# Patient Record
Sex: Male | Born: 1995 | Hispanic: Yes | Smoking: Current some day smoker
Health system: Southern US, Community
[De-identification: ages and names within clinical notes are randomized; demographics above are authoritative.]

---
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
## 2016-03-30 ENCOUNTER — Emergency Department (HOSPITAL_COMMUNITY)
Admission: EM | Admit: 2016-03-30 | Discharge: 2016-03-30 | Disposition: A | Discharge status: Home / Self Care | Payer: Self-pay | Attending: Emergency Medicine | Admitting: Emergency Medicine

## 2016-03-30 ENCOUNTER — Encounter (HOSPITAL_COMMUNITY): Payer: Self-pay | Admitting: Emergency Medicine

## 2016-03-30 DIAGNOSIS — S61213A Laceration without foreign body of left middle finger without damage to nail, initial encounter: Secondary | ICD-10-CM | POA: Insufficient documentation

## 2016-03-30 DIAGNOSIS — Y999 Unspecified external cause status: Secondary | ICD-10-CM | POA: Insufficient documentation

## 2016-03-30 DIAGNOSIS — Z23 Encounter for immunization: Secondary | ICD-10-CM | POA: Insufficient documentation

## 2016-03-30 DIAGNOSIS — W260XXA Contact with knife, initial encounter: Secondary | ICD-10-CM | POA: Insufficient documentation

## 2016-03-30 DIAGNOSIS — Y929 Unspecified place or not applicable: Secondary | ICD-10-CM | POA: Insufficient documentation

## 2016-03-30 DIAGNOSIS — Y9389 Activity, other specified: Secondary | ICD-10-CM | POA: Insufficient documentation

## 2016-03-30 MED ORDER — TETANUS-DIPHTH-ACELL PERTUSSIS 5-2.5-18.5 LF-MCG/0.5 IM SUSP
0.5000 mL | Freq: Once | INTRAMUSCULAR | Status: AC
  Administered 2016-03-30: 0.5 mL via INTRAMUSCULAR
  Filled 2016-03-30: qty 0.5

## 2016-03-30 NOTE — ED Provider Notes (Signed)
[facility]-EMERGENCY DEPT Provider Note   CSN: [id number] Arrival date & time: 03/30/16  1729   By signing my name below, I, Soijett Blue, attest that this documentation has been prepared under the direction and in the presence of Bethel BornKelly Marie Louretta Tantillo, PA-C Electronically Signed: Soijett Blue, ED Scribe. 03/30/16. 6:53 PM.  History   Chief Complaint Chief Complaint  Patient presents with  . Finger Injury    HPI Johnny Powell is a 21 y.o. male who presents to the Emergency Department complaining of left middle finger injury occurring 2 hours ago PTA. Pt reports associated left middle finger pain and laceration to left middle finger. Pt has not tried any medications for the relief of his symptoms. He states that he was working on his car when he accidentally cut his left middle finger with a knife. Per pt chart review, his last tetanus vaccination was 02/04/2007. He denies numbness, weakness, color change, swelling, and any other symptoms.    The history is provided by the patient. No language interpreter was used.    History reviewed. No pertinent past medical history.  Patient Active Problem List   Diagnosis Date Noted  . Shin splints 09/02/2013  . Other allergic rhinitis 09/02/2013  . SCOLIOSIS, THORACOLUMBAR, MILD 02/21/2009  . ALLERGIC RHINITIS 12/08/2008  . Pectus carinatum 12/08/2008  . UPPER RESPIRATORY INFECTION, VIRAL 02/04/2007    History reviewed. No pertinent surgical history.     Home Medications    Prior to Admission medications   Medication Sig Start Date End Date Taking? Authorizing Provider  cetirizine (ZYRTEC) 10 MG tablet Take 1 tablet (10 mg total) by mouth daily. 09/02/13   Jonetta OsgoodKirsten Brown, MD  fluticasone (FLONASE) 50 MCG/ACT nasal spray Place 2 sprays into both nostrils daily. 1 spray in each nostril every day 09/02/13   Jonetta OsgoodKirsten Brown, MD    Family History History reviewed. No pertinent family history.  Social History Social History  Substance Use Topics    . Smoking status: Never Smoker  . Smokeless tobacco: Never Used  . Alcohol use No     Allergies   Patient has no known allergies.   Review of Systems Review of Systems  Musculoskeletal: Positive for arthralgias (left middle finger). Negative for joint swelling.  Skin: Positive for wound (abrasion to left middle finger). Negative for color change.  Neurological: Negative for numbness.     Physical Exam Updated Vital Signs BP 134/81 (BP Location: Left Arm)   Pulse 77   Temp 98 F (36.7 C) (Oral)   Resp 16   SpO2 99%   Physical Exam  Constitutional: He is oriented to person, place, and time. He appears well-developed and well-nourished. No distress.  HENT:  Head: Normocephalic and atraumatic.  Eyes: EOM are normal.  Neck: Neck supple.  Cardiovascular: Normal rate.   Pulmonary/Chest: Effort normal. No respiratory distress.  Abdominal: He exhibits no distension.  Musculoskeletal: Normal range of motion.       Left hand: He exhibits laceration. He exhibits normal range of motion. Normal sensation noted. Normal strength noted.  Left hand: 2.5 cm U-shaped laceration over the DIP joint of the left middle finger. Bleeding is controlled. FROM of finger. NVI. See Haiku picture for details  Neurological: He is alert and oriented to person, place, and time.  Skin: Skin is warm and dry.  Psychiatric: He has a normal mood and affect. His behavior is normal.  Nursing note and vitals reviewed.      ED Treatments / Results  DIAGNOSTIC STUDIES:  Oxygen Saturation is 99% on RA, nl by my interpretation.    COORDINATION OF CARE: 6:51 PM Discussed treatment plan with pt at bedside which includes laceration repair, update tetanus vaccination, and pt agreed to plan.   Procedures .Marland KitchenLaceration Repair Date/Time: 03/30/2016 7:06 PM Performed by: Terance Hart MARIE Authorized by: Terance Hart MARIE   Consent:    Consent obtained:  Verbal   Consent given by:  Patient   Risks  discussed:  Pain and need for additional repair Anesthesia (see MAR for exact dosages):    Anesthesia method:  Local infiltration   Local anesthetic:  Lidocaine 2% w/o epi (5 cc used) Laceration details:    Location:  Finger   Finger location:  L long finger   Length (cm):  2.5 Repair type:    Repair type:  Simple Pre-procedure details:    Preparation:  Patient was prepped and draped in usual sterile fashion Exploration:    Hemostasis achieved with:  Direct pressure   Wound exploration: wound explored through full range of motion and entire depth of wound probed and visualized     Wound extent: no foreign bodies/material noted and no muscle damage noted     Contaminated: no   Treatment:    Area cleansed with:  Saline   Amount of cleaning:  Standard   Irrigation solution:  Sterile saline   Irrigation method:  Syringe   Visualized foreign bodies/material removed: no   Skin repair:    Repair method:  Sutures   Suture size:  4-0   Suture material:  Prolene   Suture technique:  Simple interrupted   Number of sutures:  5 Approximation:    Approximation:  Close Post-procedure details:    Dressing:  Adhesive bandage and sterile dressing   Patient tolerance of procedure:  Tolerated well, no immediate complications     (including critical care time)  Medications Ordered in ED Medications  Tdap (BOOSTRIX) injection 0.5 mL (0.5 mLs Intramuscular Given 03/30/16 2003)     Initial Impression / Assessment and Plan / ED Course  I have reviewed the triage vital signs and the nursing notes.  21 year old male with finger laceration. Tetanus updated in the ED. Laceration occurred < 12 hours prior to repair. Patient tolerated repair well. Discussed laceration care with pt and answered questions. Pt to f-u for suture removal in 10 days and wound check sooner should there be signs of dehiscence or infection. Pt is hemodynamically stable with no complaints prior to dc.    Final Clinical  Impressions(s) / ED Diagnoses   Final diagnoses:  Laceration of left middle finger without foreign body without damage to nail, initial encounter    New Prescriptions New Prescriptions   No medications on file   I personally performed the services described in this documentation, which was scribed in my presence. The recorded information has been reviewed and is accurate.     Bethel Born, PA-C 03/31/16 0030    Eber Hong, MD 03/31/16 [phone]

## 2016-03-30 NOTE — ED Notes (Signed)
Placed patient into room waiting on provider

## 2016-03-30 NOTE — ED Triage Notes (Signed)
Pt cut his hand while working on his car. Small abrasion to left middle finger. Pt able to move finger. Bleeding controlled

## 2016-03-30 NOTE — ED Notes (Signed)
Pt stable, ambulatory, states understanding of discharge instructions 

## 2016-03-30 NOTE — Discharge Instructions (Signed)
Keep area clean and dry. You can wash with soap and water Change bandage at least once daily, more if it is dirty Watch for signs of infection (redness, drainage) Have stitches removed in 10 days

## 2017-06-11 ENCOUNTER — Other Ambulatory Visit: Payer: Self-pay | Admitting: Internal Medicine

## 2019-05-28 ENCOUNTER — Ambulatory Visit (HOSPITAL_COMMUNITY)
Admission: EM | Admit: 2019-05-28 | Discharge: 2019-05-28 | Disposition: A | Discharge status: Home / Self Care | Payer: BC Managed Care – PPO | Attending: Urgent Care | Admitting: Urgent Care

## 2019-05-28 ENCOUNTER — Encounter (HOSPITAL_COMMUNITY): Payer: Self-pay

## 2019-05-28 ENCOUNTER — Other Ambulatory Visit: Payer: Self-pay

## 2019-05-28 DIAGNOSIS — Z8659 Personal history of other mental and behavioral disorders: Secondary | ICD-10-CM

## 2019-05-28 DIAGNOSIS — F439 Reaction to severe stress, unspecified: Secondary | ICD-10-CM

## 2019-05-28 DIAGNOSIS — R5383 Other fatigue: Secondary | ICD-10-CM

## 2019-05-28 DIAGNOSIS — F419 Anxiety disorder, unspecified: Secondary | ICD-10-CM

## 2019-05-28 MED ORDER — FLUOXETINE HCL 10 MG PO TABS: 10.0000 mg | ORAL_TABLET | Freq: Every day | ORAL | 0 refills | Status: DC

## 2019-05-28 NOTE — Discharge Instructions (Addendum)
For the first 2 weeks, start with 1 capsule daily. After that, if you are tolerating the medicine okay then increase to 2 capsules daily.

## 2019-05-28 NOTE — ED Triage Notes (Signed)
Pt states he needs a refill of his anxiety medication, he do not remember the name.

## 2019-05-28 NOTE — ED Provider Notes (Signed)
[place]   MRN: [id number] DOB: 1995/12/12  Subjective:   Johnny Powell is a 24 y.o. male presenting for 4-week history of recurrent fatigue, anxiety.  Patient previously took fluoxetine but this was several years ago.  Currently patient has significant stressors including recently moving in with his fiance, financial stressors as well.  One of his family members is very ill as well.  States that he is having a difficult time controlling his worrying and feels like he is starting to get depressed as well.  Denies any suicidal, homicidal ideation.  Patient has previously done very well with fluoxetine.  He does not currently have a PCP.  No current facility-administered medications for this encounter.  Current Outpatient Medications:  .  cetirizine (ZYRTEC) 10 MG tablet, Take 1 tablet (10 mg total) by mouth daily., Disp: 30 tablet, Rfl: 12 .  fluticasone (FLONASE) 50 MCG/ACT nasal spray, Place 2 sprays into both nostrils daily. 1 spray in each nostril every day, Disp: 16 g, Rfl: 12   No Known Allergies  History reviewed. No pertinent past medical history.   History reviewed. No pertinent surgical history.  History reviewed. No pertinent family history.  Social History   Tobacco Use  . Smoking status: Never Smoker  . Smokeless tobacco: Never Used  Substance Use Topics  . Alcohol use: No  . Drug use: No    ROS   Objective:   Vitals: BP 123/79 (BP Location: Right Arm)   Pulse 73   Temp 98.7 F (37.1 C) (Oral)   Resp 17   SpO2 100%   Physical Exam Constitutional:      General: He is not in acute distress.    Appearance: Normal appearance. He is well-developed and normal weight. He is not ill-appearing, toxic-appearing or diaphoretic.  HENT:     Head: Normocephalic and atraumatic.     Right Ear: External ear normal.     Left Ear: External ear normal.     Nose: Nose normal.     Mouth/Throat:     Pharynx: Oropharynx is clear.  Eyes:     General: No  scleral icterus.       Right eye: No discharge.        Left eye: No discharge.     Extraocular Movements: Extraocular movements intact.     Pupils: Pupils are equal, round, and reactive to light.  Cardiovascular:     Rate and Rhythm: Normal rate.  Pulmonary:     Effort: Pulmonary effort is normal.  Musculoskeletal:     Cervical back: Normal range of motion.  Neurological:     Mental Status: He is alert and oriented to person, place, and time.  Psychiatric:        Mood and Affect: Mood is depressed. Mood is not anxious or elated. Affect is flat. Affect is not labile, blunt, angry, tearful or inappropriate.        Speech: He is communicative. Speech is not rapid and pressured, delayed, slurred or tangential.        Behavior: Behavior is slowed. Behavior is not agitated, aggressive, withdrawn, hyperactive or combative. Behavior is cooperative.        Thought Content: Thought content normal. Thought content is not paranoid or delusional. Thought content does not include homicidal or suicidal ideation.        Cognition and Memory: Cognition normal.        Judgment: Judgment normal.     Comments: Monotone speech.  Assessment and Plan :   PDMP not reviewed this encounter.  1. Anxiety   2. Stress   3. History of depression   4. Fatigue, unspecified type   5. Decreased energy     Will restart fluoxetine and counseled patient on titrating his dose up to 20 mg over a period of 2 weeks.  Recommended he establish care with a new PCP, information provided to the patient for this. Counseled patient on potential for adverse effects with medications prescribed/recommended today, ER and return-to-clinic precautions discussed, patient verbalized understanding.    Wallis Bamberg, [place] 05/29/19 [phone]

## 2021-09-22 ENCOUNTER — Ambulatory Visit (HOSPITAL_COMMUNITY)
Admission: EM | Admit: 2021-09-22 | Discharge: 2021-09-23 | Discharge status: Against Medical Advice | Payer: No Payment, Other | Attending: Family | Admitting: Family

## 2021-09-22 DIAGNOSIS — F1010 Alcohol abuse, uncomplicated: Secondary | ICD-10-CM | POA: Insufficient documentation

## 2021-09-22 DIAGNOSIS — R45851 Suicidal ideations: Secondary | ICD-10-CM | POA: Insufficient documentation

## 2021-09-22 DIAGNOSIS — Z20822 Contact with and (suspected) exposure to covid-19: Secondary | ICD-10-CM | POA: Insufficient documentation

## 2021-09-22 DIAGNOSIS — F1090 Alcohol use, unspecified, uncomplicated: Secondary | ICD-10-CM

## 2021-09-22 DIAGNOSIS — F322 Major depressive disorder, single episode, severe without psychotic features: Secondary | ICD-10-CM | POA: Diagnosis not present

## 2021-09-22 DIAGNOSIS — R630 Anorexia: Secondary | ICD-10-CM | POA: Diagnosis not present

## 2021-09-22 DIAGNOSIS — F411 Generalized anxiety disorder: Secondary | ICD-10-CM | POA: Diagnosis not present

## 2021-09-22 DIAGNOSIS — F1110 Opioid abuse, uncomplicated: Secondary | ICD-10-CM | POA: Insufficient documentation

## 2021-09-22 DIAGNOSIS — Z560 Unemployment, unspecified: Secondary | ICD-10-CM | POA: Insufficient documentation

## 2021-09-22 DIAGNOSIS — Z79899 Other long term (current) drug therapy: Secondary | ICD-10-CM | POA: Insufficient documentation

## 2021-09-22 LAB — POCT URINE DRUG SCREEN - MANUAL ENTRY (I-SCREEN)
POC Amphetamine UR: NOT DETECTED
POC Buprenorphine (BUP): NOT DETECTED
POC Cocaine UR: NOT DETECTED
POC Marijuana UR: POSITIVE — AB
POC Methadone UR: NOT DETECTED
POC Methamphetamine UR: NOT DETECTED
POC Morphine: NOT DETECTED
POC Oxazepam (BZO): NOT DETECTED
POC Oxycodone UR: NOT DETECTED
POC Secobarbital (BAR): NOT DETECTED

## 2021-09-22 LAB — CBC WITH DIFFERENTIAL/PLATELET
Abs Immature Granulocytes: 0.03 10*3/uL (ref 0.00–0.07)
Basophils Absolute: 0 10*3/uL (ref 0.0–0.1)
Basophils Relative: 0 %
Eosinophils Absolute: 0.1 10*3/uL (ref 0.0–0.5)
Eosinophils Relative: 1 %
HCT: 44.7 % (ref 39.0–52.0)
Hemoglobin: 15.9 g/dL (ref 13.0–17.0)
Immature Granulocytes: 0 %
Lymphocytes Relative: 14 %
Lymphs Abs: 1.3 10*3/uL (ref 0.7–4.0)
MCH: 32 pg (ref 26.0–34.0)
MCHC: 35.6 g/dL (ref 30.0–36.0)
MCV: 89.9 fL (ref 80.0–100.0)
Monocytes Absolute: 0.6 10*3/uL (ref 0.1–1.0)
Monocytes Relative: 7 %
Neutro Abs: 7 10*3/uL (ref 1.7–7.7)
Neutrophils Relative %: 78 %
Platelets: 293 10*3/uL (ref 150–400)
RBC: 4.97 MIL/uL (ref 4.22–5.81)
RDW: 12.7 % (ref 11.5–15.5)
WBC: 9 10*3/uL (ref 4.0–10.5)
nRBC: 0 % (ref 0.0–0.2)

## 2021-09-22 LAB — COMPREHENSIVE METABOLIC PANEL
ALT: 34 U/L (ref 0–44)
AST: 30 U/L (ref 15–41)
Albumin: 4.5 g/dL (ref 3.5–5.0)
Alkaline Phosphatase: 92 U/L (ref 38–126)
Anion gap: 10 (ref 5–15)
BUN: 9 mg/dL (ref 6–20)
CO2: 28 mmol/L (ref 22–32)
Calcium: 9.3 mg/dL (ref 8.9–10.3)
Chloride: 102 mmol/L (ref 98–111)
Creatinine, Ser: 1.25 mg/dL — ABNORMAL HIGH (ref 0.61–1.24)
GFR, Estimated: >60 mL/min (ref >60)
Glucose, Bld: 104 mg/dL — ABNORMAL HIGH (ref 70–99)
Potassium: 3.7 mmol/L (ref 3.5–5.1)
Sodium: 140 mmol/L (ref 135–145)
Total Bilirubin: 0.5 mg/dL (ref 0.3–1.2)
Total Protein: 8.1 g/dL (ref 6.5–8.1)

## 2021-09-22 LAB — RESP PANEL BY RT-PCR (FLU A&B, COVID) ARPGX2
Influenza A by PCR: NEGATIVE
Influenza B by PCR: NEGATIVE
SARS Coronavirus 2 by RT PCR: NEGATIVE

## 2021-09-22 LAB — TSH: TSH: 0.641 u[IU]/mL (ref 0.350–4.500)

## 2021-09-22 LAB — POC SARS CORONAVIRUS 2 AG: SARSCOV2ONAVIRUS 2 AG: NEGATIVE

## 2021-09-22 MED ORDER — HYDROXYZINE HCL 25 MG PO TABS
25.0000 mg | ORAL_TABLET | Freq: Four times a day (QID) | ORAL | Status: DC | PRN
  Administered 2021-09-23: 25 mg via ORAL
  Filled 2021-09-22: qty 1

## 2021-09-22 MED ORDER — LOPERAMIDE HCL 2 MG PO CAPS: 2.0000 mg | ORAL_CAPSULE | ORAL | Status: DC | PRN

## 2021-09-22 MED ORDER — HYDROXYZINE HCL 25 MG PO TABS: 25.0000 mg | ORAL_TABLET | Freq: Three times a day (TID) | ORAL | Status: DC | PRN

## 2021-09-22 MED ORDER — TRAZODONE HCL 50 MG PO TABS: 50.0000 mg | ORAL_TABLET | Freq: Every evening | ORAL | Status: DC | PRN

## 2021-09-22 MED ORDER — THIAMINE MONONITRATE 100 MG PO TABS
100.0000 mg | ORAL_TABLET | Freq: Every day | ORAL | Status: DC
  Administered 2021-09-23: 100 mg via ORAL
  Filled 2021-09-22: qty 1

## 2021-09-22 MED ORDER — THIAMINE HCL 100 MG/ML IJ SOLN
100.0000 mg | Freq: Once | INTRAMUSCULAR | Status: AC
  Administered 2021-09-22: 100 mg via INTRAMUSCULAR
  Filled 2021-09-22: qty 2

## 2021-09-22 MED ORDER — NICOTINE 21 MG/24HR TD PT24
21.0000 mg | MEDICATED_PATCH | Freq: Every day | TRANSDERMAL | Status: DC
  Administered 2021-09-22 – 2021-09-23 (×2): 21 mg via TRANSDERMAL
  Filled 2021-09-22 (×2): qty 1

## 2021-09-22 MED ORDER — ALUM & MAG HYDROXIDE-SIMETH 200-200-20 MG/5ML PO SUSP: 30.0000 mL | ORAL | Status: DC | PRN

## 2021-09-22 MED ORDER — SERTRALINE HCL 50 MG PO TABS
50.0000 mg | ORAL_TABLET | Freq: Once | ORAL | Status: AC
Start: 2021-09-22 — End: 2021-09-22
  Administered 2021-09-22: 50 mg via ORAL
  Filled 2021-09-22: qty 1

## 2021-09-22 MED ORDER — MAGNESIUM HYDROXIDE 400 MG/5ML PO SUSP: 30.0000 mL | Freq: Every day | ORAL | Status: DC | PRN

## 2021-09-22 MED ORDER — ONDANSETRON 4 MG PO TBDP
4.0000 mg | ORAL_TABLET | Freq: Four times a day (QID) | ORAL | Status: DC | PRN
  Administered 2021-09-23: 4 mg via ORAL
  Filled 2021-09-22: qty 1

## 2021-09-22 MED ORDER — LORAZEPAM 1 MG PO TABS
1.0000 mg | ORAL_TABLET | Freq: Four times a day (QID) | ORAL | Status: DC | PRN
  Administered 2021-09-23: 1 mg via ORAL
  Filled 2021-09-22: qty 1

## 2021-09-22 MED ORDER — ACETAMINOPHEN 325 MG PO TABS: 650.0000 mg | ORAL_TABLET | Freq: Four times a day (QID) | ORAL | Status: DC | PRN

## 2021-09-22 MED ORDER — ADULT MULTIVITAMIN W/MINERALS CH
1.0000 | ORAL_TABLET | Freq: Every day | ORAL | Status: DC
  Administered 2021-09-22 – 2021-09-23 (×2): 1 via ORAL
  Filled 2021-09-22 (×2): qty 1

## 2021-09-22 NOTE — ED Notes (Signed)
[facility] C[facility] called to collect STAT specimens and to deliver to [facility].

## 2021-09-22 NOTE — Progress Notes (Signed)
Received Rein on OBS after his admission workup and to wait for clearance to go to [facility]. The skin assessment was completed and he was oriented to his new environment. He endorsed passive SI without a plan along with feeling depressed and anxious related to his breakup with his baby mommy. He was medicated per order and given words of encouragement.

## 2021-09-22 NOTE — ED Notes (Signed)
Refused to eat dinner. 

## 2021-09-22 NOTE — ED Provider Notes (Cosign Needed Addendum)
Facility Based Crisis Admission H&P  Date: 09/22/21 Patient Name: Johnny Powell MRN: [id number] Chief Complaint:  Depression      Diagnoses:  Final diagnoses:  Generalized anxiety disorder  Current severe episode of major depressive disorder without psychotic features, unspecified whether recurrent [facility])   HPI: Johnny Powell presents to [facility] urgent care accompanied by his mother.  Patient observed sitting with mother prior to assessment tearful, flat and guarded.  During this assessment patient denying any depression or depressive symptoms however is very tearful and flat.  Johnny Powell provided verbal authorization to follow-up with his mother for additional collateral.  Reports passive thoughts of suicide however did not elaborate on current stressors.  Johnny Powell stated has been struggling with depression and anxiety since the age of 61. Stated more recently Johnny Powell has been struggling with suicidal thoughts and worsening depression due to a recent break-up with a girlfriend." I am not doing nothing right according to her."   Additional collateral provided by patient's mother who states patient has not been eating or drinking as normally.  She reports social isolation and stated Johnny Powell has made statements about death.  States Johnny Powell drinks alcohol daily.  Patient admits to drinking beer 24 pack the past 2 years. Denies any withdrawal symptoms related to not having alcohol.  Reports his last drink of beer earlier this morning.    Reported Johnny Powell was prescribed Prozac in the past however did not feel this medication helped with the symptoms.    During evaluation Johnny Powell is sitting in no acute distress. Johnny Powell is alert/oriented x 4; calm/cooperative; and mood congruent with affect.  Heis speaking in a clear tone at moderate volume, and normal pace; with good eye contact.  His thought process is coherent and relevant; There is no indication that Johnny Powell is currently responding to internal/external stimuli or experiencing  delusional thought content; and Johnny Powell has denied suicidal/self-harm/homicidal ideation, psychosis, and paranoia. Reported passive ideation, denied plan or intent. Patient has remained calm, tearful  throughout assessment and has answered questions appropriately.      PHQ 2-9:  Flowsheet Row ED from 09/22/2021 in [facility]  Thoughts that you would be better off dead, or of hurting yourself in some way More than half the days  PHQ-9 Total Score 17       Flowsheet Row ED from 09/22/2021 in [facility]  C-SSRS RISK CATEGORY Low Risk        Total Time spent with patient: 15 minutes  Musculoskeletal  Strength & Muscle Tone: within normal limits Gait & Station: normal Patient leans: N/A  Psychiatric Specialty Exam  Presentation General Appearance: Appropriate for Environment  Eye Contact:Good  Speech:Clear and Coherent  Speech Volume:Normal  Handedness:Right   Mood and Affect  Mood:Anxious; Depressed  Affect:Congruent   Thought Process  Thought Processes:Coherent  Descriptions of Associations:Intact  Orientation:Full (Time, Place and Person)  Thought Content:Logical    Hallucinations:Hallucinations: None  Ideas of Reference:None  Suicidal Thoughts:Suicidal Thoughts: No  Homicidal Thoughts:Homicidal Thoughts: No   Sensorium  Memory:Immediate Good; Recent Good; Remote Good  Judgment:Fair  Insight:Fair   Executive Functions  Concentration:Fair  Attention Span:Fair  Recall:Good  Fund of Knowledge:Good  Language:Good   Psychomotor Activity  Psychomotor Activity:Psychomotor Activity: Normal   Assets  Assets:Communication Skills; Desire for Improvement; Leisure Time   Sleep  Sleep:Sleep: Fair   Nutritional Assessment (For OBS and FBC admissions only) Has the patient had a weight loss or gain of 10 pounds or more  in the last 3 months?: No Has the patient had a decrease in food  intake/or appetite?: No Does the patient have dental problems?: No Does the patient have eating habits or behaviors that may be indicators of an eating disorder including binging or inducing vomiting?: No Has the patient recently lost weight without trying?: 0 Has the patient been eating poorly because of a decreased appetite?: 0 Malnutrition Screening Tool Score: 0    Physical Exam Vitals and nursing note reviewed.  Cardiovascular:     Rate and Rhythm: Normal rate.  Neurological:     General: No focal deficit present.     Mental Status: Johnny Powell is alert.  Psychiatric:        Mood and Affect: Mood normal.        Behavior: Behavior normal.        Thought Content: Thought content normal.    Review of Systems  Respiratory: Negative.    Cardiovascular: Negative.   Genitourinary: Negative.   Musculoskeletal: Negative.   Endo/Heme/Allergies: Negative.   Psychiatric/Behavioral:  Positive for depression. Negative for suicidal ideas (passive ideaitosn). The patient is nervous/anxious.   All other systems reviewed and are negative.   Blood pressure 125/88, pulse 86, temperature 98.8 F (37.1 C), temperature source Oral, resp. rate 20, SpO2 95 %. There is no height or weight on file to calculate BMI.  Past Psychiatric History:    Is the patient at risk to self? No  Has the patient been a risk to self in the past 6 months? No .    Has the patient been a risk to self within the distant past? No   Is the patient a risk to others? No   Has the patient been a risk to others in the past 6 months? No   Has the patient been a risk to others within the distant past? No   Past Medical History: No past medical history on file. No past surgical history on file.  Family History: No family history on file.  Social History:  Social History   Socioeconomic History   Marital status: Significant Other    Spouse name: Not on file   Number of children: Not on file   Years of education: Not on file    Highest education level: Not on file  Occupational History   Not on file  Tobacco Use   Smoking status: Never   Smokeless tobacco: Never  Substance and Sexual Activity   Alcohol use: No   Drug use: No   Sexual activity: Not on file  Other Topics Concern   Not on file  Social History Narrative   Not on file   Social Determinants of Health   Financial Resource Strain: Not on file  Food Insecurity: Not on file  Transportation Needs: Not on file  Physical Activity: Not on file  Stress: Not on file  Social Connections: Not on file  Intimate Partner Violence: Not on file    SDOH:  SDOH Screenings   Depression (PHQ2-9): High Risk (09/22/2021)  Tobacco Use: Low Risk  (02/01/2020)    Last Labs:  Admission on 09/22/2021  Component Date Value Ref Range Status   POC Amphetamine UR 09/22/2021 None Detected  NONE DETECTED (Cut Off Level 1000 ng/mL) Final   POC Secobarbital (BAR) 09/22/2021 None Detected  NONE DETECTED (Cut Off Level 300 ng/mL) Final   POC Buprenorphine (BUP) 09/22/2021 None Detected  NONE DETECTED (Cut Off Level 10 ng/mL) Final   POC Oxazepam (BZO)  09/22/2021 None Detected  NONE DETECTED (Cut Off Level 300 ng/mL) Final   POC Cocaine UR 09/22/2021 None Detected  NONE DETECTED (Cut Off Level 300 ng/mL) Final   POC Methamphetamine UR 09/22/2021 None Detected  NONE DETECTED (Cut Off Level 1000 ng/mL) Final   POC Morphine 09/22/2021 None Detected  NONE DETECTED (Cut Off Level 300 ng/mL) Final   POC Methadone UR 09/22/2021 None Detected  NONE DETECTED (Cut Off Level 300 ng/mL) Final   POC Oxycodone UR 09/22/2021 None Detected  NONE DETECTED (Cut Off Level 100 ng/mL) Final   POC Marijuana UR 09/22/2021 Positive (A)  NONE DETECTED (Cut Off Level 50 ng/mL) Final    Allergies: Patient has no known allergies.  PTA Medications: (Not in a hospital admission)   Long Term Goals: Improvement in symptoms so as ready for discharge  Short Term Goals: Patient will verbalize  feelings in meetings with treatment team members., Patient will attend at least of 50% of the groups daily., and Pt will complete the PHQ9 on admission, day 3 and discharge.  Medical Decision Making  Admitted to [facility]  - consider starting antidepressant  - CMP, CBC, COVID and TSH- pending results    Recommendations  Based on my evaluation the patient appears to have an emergency medical condition for which I recommend the patient be transferred to the emergency department for further evaluation.  Oneta Rack, NP 09/22/21  3:33 PM
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
## 2021-09-22 NOTE — BH Assessment (Signed)
Comprehensive Clinical Assessment (CCA) Note  09/22/2021 Johnny Powell [id number] DISPOSITIONMelvyn Neth NP recommends [facility] for patient.   Flowsheet Row ED from 09/22/2021 in [facility]  C-SSRS RISK CATEGORY High Risk      The patient demonstrates the following risk factors for suicide: Chronic risk factors for suicide include: psychiatric disorder of depression . Acute risk factors for suicide include: family or marital conflict. Protective factors for this patient include: coping skills. Considering these factors, the overall suicide risk at this point appears to be moderate. Patient is not appropriate for outpatient follow up.   Patient is a 26 year old male that presents this date as a voluntary walk in to [facility] with passive S/I. Patient denies any immediate plan to self harm although reports he "doesn't want to go on." Patient is a poor historian and renders limited history answering most questions with "yes and no." Patient denies any H/I or AVH. Patient denies any previous attempts or gestures at self harm. Patient states he has been struggling with depression and anxiety since the age of 26. Stated more recently he has been struggling with suicidal thoughts and worsening depression due to a recent break-up with a girlfriend." Patient reports they have one child together. Patient states he is currently unemployed. Patient reports daily alcohol use stating that he consumes one to two 12 oz beers daily for a year with last use prior to arrival when he reported he had "one beer." Patient denies any other SA issues or current withdrawals. Patient denies any current OP services associated with mental health or any medications. Patient states he has a history of being on Prozac that was prescribed over 7 years ago for depression. Patient denies any medication interventions since then. Patient reports current depressive symptoms to include: hopelessness, excessive fatigue, isolating  and anhedonia.      Patient is alert/oriented x 4, calm/cooperative; and mood congruent with affect. He is speaking in a clear tone at moderate volume, and normal pace; with good eye contact. His thought process is coherent and relevant; There is no indication that he is currently responding to internal/external stimuli or experiencing delusional thought content.    Chief Complaint: No chief complaint on file.  Visit Diagnosis: MDD recurrent without psychotic features, severe, Alcohol abuse.     CCA Screening, Triage and Referral (STR)  Patient Reported Information How did you hear about [place]? Self  What Is the Reason for Your Visit/Call Today? Pt presents with ongoing S/I patient denies any immediate plan  How Long Has This Been Causing You Problems? 1 wk - 1 month  What Do You Feel Would Help You the Most Today? Treatment for Depression or other mood problem   Have You Recently Had Any Thoughts About Hurting Yourself? Yes  Are You Planning to Commit Suicide/Harm Yourself At This time? No   Have you Recently Had Thoughts About Hurting Someone Johnny Powell? No  Are You Planning to Harm Someone at This Time? No  Explanation: No data recorded  Have You Used Any Alcohol or Drugs in the Past 24 Hours? Yes  How Long Ago Did You Use Drugs or Alcohol? No data recorded What Did You Use and How Much? Pt reports he had 'one beer" prior to arrival   Do You Currently Have a Therapist/Psychiatrist? No  Name of Therapist/Psychiatrist: No data recorded  Have You Been Recently Discharged From Any Office Practice or Programs? No  Explanation of Discharge From Practice/Program: No data recorded  CCA Screening Triage Referral Assessment Type of Contact: Face-to-Face  Telemedicine Service Delivery:   Is this Initial or Reassessment? No data recorded Date Telepsych consult ordered in [facility]:  No data recorded Time Telepsych consult ordered in [facility]:  No data recorded Location of Assessment: [facility]  [facility] Assessment Services  Provider Location: [facility] Assessment Services   Collateral Involvement: None at this time   Does Patient Have a Automotive engineer Guardian? No data recorded Name and Contact of Legal Guardian: No data recorded If Minor and Not Living with Parent(s), Who has Custody? NA  Is CPS involved or ever been involved? Never  Is APS involved or ever been involved? Never   Patient Determined To Be At Risk for Harm To Self or Others Based on Review of Patient Reported Information or Presenting Complaint? Yes, for Self-Harm  Method: No data recorded Availability of Means: No data recorded Intent: No data recorded Notification Required: No data recorded Additional Information for Danger to Others Potential: No data recorded Additional Comments for Danger to Others Potential: No data recorded Are There Guns or Other Weapons in Your Home? No data recorded Types of Guns/Weapons: No data recorded Are These Weapons Safely Secured?                            No data recorded Who Could Verify You Are Able To Have These Secured: No data recorded Do You Have any Outstanding Charges, Pending Court Dates, Parole/Probation? No data recorded Contacted To Inform of Risk of Harm To Self or Others: Other: Comment (NA)    Does Patient Present under Involuntary Commitment? No  IVC Papers Initial File Date: No data recorded  [place] of Residence: [place]   Patient Currently Receiving the Following Services: Not Receiving Services   Determination of Need: Urgent (48 hours)   Options For Referral: Facility-Based Crisis     CCA Biopsychosocial Patient Reported Schizophrenia/Schizoaffective Diagnosis in Past: No   Strengths: Pt is willing to participate in treatment   Mental Health Symptoms Depression:   Change in energy/activity; Difficulty Concentrating; Hopelessness   Duration of Depressive symptoms:  Duration of Depressive Symptoms: Greater than two weeks    Mania:   None   Anxiety:    Difficulty concentrating; Restlessness   Psychosis:   None   Duration of Psychotic symptoms:    Trauma:   None   Obsessions:   None   Compulsions:   None   Inattention:   None   Hyperactivity/Impulsivity:   None   Oppositional/Defiant Behaviors:   None   Emotional Irregularity:   Chronic feelings of emptiness   Other Mood/Personality Symptoms:   NA    Mental Status Exam Appearance and self-care  Stature:   Average   Weight:   Average weight   Clothing:   Neat/clean   Grooming:   Normal   Cosmetic use:   None   Posture/gait:   Normal   Motor activity:   Not Remarkable   Sensorium  Attention:   Normal   Concentration:   Normal   Orientation:   X5   Recall/memory:   Normal   Affect and Mood  Affect:   Appropriate   Mood:   Depressed   Relating  Eye contact:   Normal   Facial expression:   Depressed   Attitude toward examiner:   Cooperative   Thought and Language  Speech flow:  Clear and Coherent   Thought content:  Appropriate to Mood and Circumstances   Preoccupation:   None   Hallucinations:   None   Organization:  No data recorded  [employer] of Knowledge:   Fair   Intelligence:   Average   Abstraction:   Normal   Judgement:   Fair   Programmer, systems   Insight:   Fair   Decision Making:   Normal   Social Functioning  Social Maturity:   Responsible   Social Judgement:   Normal   Stress  Stressors:   Family conflict   Coping Ability:   Human resources officer Deficits:   None   Supports:   Family     Religion: Religion/Spirituality Are You A Religious Person?: Yes (Pt states he is spiritual) How Might This Affect Treatment?: NA  Leisure/Recreation: Leisure / Recreation Do You Have Hobbies?: No  Exercise/Diet: Exercise/Diet Do You Exercise?: No Have You Gained or Lost A Significant Amount of Weight in the Past  Six Months?: No Do You Follow a Special Diet?: No Do You Have Any Trouble Sleeping?: Yes Explanation of Sleeping Difficulties: Pt reports only sleeping 2 to 3 hours a night for the last 6 months   CCA Employment/Education Employment/Work Situation: Employment / Work Situation Employment Situation: Unemployed Patient's Job has Been Impacted by Current Illness: No Has Patient ever Been in Equities trader?: No  Education: Education Is Patient Currently Attending School?: No Last Grade Completed: 12 Did You Product manager?: No Did You Have An Individualized Education Program (IIEP): No Did You Have Any Difficulty At [employer]?: No Patient's Education Has Been Impacted by Current Illness: No   CCA Family/Childhood History Family and Relationship History: Family history Marital status: Long term relationship Long term relationship, how long?: 7 years What types of issues is patient dealing with in the relationship?: Ongoing conflict with his current partner "wanting to break up" with him Additional relationship information: NA Does patient have children?: Yes How many children?: 1 How is patient's relationship with their children?: Pt reports a positive relationship with child  Childhood History:  Childhood History By whom was/is the patient raised?: Both parents Did patient suffer any verbal/emotional/physical/sexual abuse as a child?: No Did patient suffer from severe childhood neglect?: No Has patient ever been sexually abused/assaulted/raped as an adolescent or adult?: No Was the patient ever a victim of a crime or a disaster?: No Witnessed domestic violence?: No Has patient been affected by domestic violence as an adult?: No  Child/Adolescent Assessment:     CCA Substance Use Alcohol/Drug Use: Alcohol / Drug Use Pain Medications: See MAR Prescriptions: See MAR Over the Counter: See MAR History of alcohol / drug use?: Yes Longest period of sobriety (when/how long):  Unknown Negative Consequences of Use:  (NA) Withdrawal Symptoms: None Substance #1 Name of Substance 1: Alcohol 1 - Age of First Use: 18 1 - Amount (size/oz): 12 oz beers 1 - Frequency: Daily 1 - Duration: Ongoing 1 - Last Use / Amount: Prior to arrival 1 12 oz beer 1- Route of Use: Oral                       ASAM's:  Six Dimensions of Multidimensional Assessment  Dimension 1:  Acute Intoxication and/or Withdrawal Potential:      Dimension 2:  Biomedical Conditions and Complications:      Dimension 3:  Emotional, Behavioral, or Cognitive Conditions and Complications:     Dimension 4:  Readiness to  Change:     Dimension 5:  Relapse, Continued use, or Continued Problem Potential:     Dimension 6:  Recovery/Living Environment:     ASAM Severity Score:    ASAM Recommended Level of Treatment:     Substance use Disorder (SUD)    Recommendations for Services/Supports/Treatments:    Discharge Disposition:    DSM5 Diagnoses: Patient Active Problem List   Diagnosis Date Noted   Shin splints 09/02/2013   Other allergic rhinitis 09/02/2013   SCOLIOSIS, THORACOLUMBAR, MILD 02/21/2009   ALLERGIC RHINITIS 12/08/2008   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

## 2021-09-22 NOTE — ED Notes (Signed)
Pt sitting in dining room watching TV. A&O x4, calm and cooperative. Denies current SI/HI/AVH. No signs of distress noted. Monitoring for safety.  

## 2021-09-23 LAB — LIPID PANEL
Cholesterol: 217 mg/dL — ABNORMAL HIGH (ref 0–200)
HDL: 81 mg/dL (ref >40)
LDL Cholesterol: 119 mg/dL — ABNORMAL HIGH (ref 0–99)
Total CHOL/HDL Ratio: 2.7 RATIO
Triglycerides: 84 mg/dL (ref <150)
VLDL: 17 mg/dL (ref 0–40)

## 2021-09-23 LAB — HEMOGLOBIN A1C
Hgb A1c MFr Bld: 5.3 % (ref 4.8–5.6)
Mean Plasma Glucose: 105.41 mg/dL

## 2021-09-23 LAB — ETHANOL: Alcohol, Ethyl (B): <10 mg/dL (ref <10)

## 2021-09-23 MED ORDER — NAPROXEN 500 MG PO TABS: 500.0000 mg | ORAL_TABLET | Freq: Two times a day (BID) | ORAL | Status: DC | PRN

## 2021-09-23 MED ORDER — SERTRALINE HCL 50 MG PO TABS
50.0000 mg | ORAL_TABLET | Freq: Every day | ORAL | Status: DC
  Administered 2021-09-23: 50 mg via ORAL
  Filled 2021-09-23: qty 1

## 2021-09-23 MED ORDER — CLONIDINE HCL 0.1 MG PO TABS: 0.1000 mg | ORAL_TABLET | Freq: Every day | ORAL | Status: DC

## 2021-09-23 MED ORDER — SERTRALINE HCL 50 MG PO TABS
50.0000 mg | ORAL_TABLET | Freq: Every day | ORAL | 0 refills | Status: AC
  Filled 2021-09-23: qty 30, 30d supply, fill #0

## 2021-09-23 MED ORDER — METHOCARBAMOL 500 MG PO TABS: 500.0000 mg | ORAL_TABLET | Freq: Three times a day (TID) | ORAL | Status: DC | PRN

## 2021-09-23 MED ORDER — CLONIDINE HCL 0.1 MG PO TABS: 0.1000 mg | ORAL_TABLET | ORAL | Status: DC

## 2021-09-23 MED ORDER — CLONIDINE HCL 0.1 MG PO TABS
0.1000 mg | ORAL_TABLET | Freq: Four times a day (QID) | ORAL | Status: DC
  Administered 2021-09-23 (×2): 0.1 mg via ORAL
  Filled 2021-09-23 (×2): qty 1

## 2021-09-23 MED ORDER — DICYCLOMINE HCL 20 MG PO TABS: 20.0000 mg | ORAL_TABLET | Freq: Four times a day (QID) | ORAL | Status: DC | PRN

## 2021-09-23 NOTE — ED Notes (Signed)
Called [facility] lab for add-on for HgB A1C and Lipid. Was informed they didn't have tube for Ethanol. Writer will attempt blood draw today.
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
## 2021-09-23 NOTE — ED Notes (Signed)
Pt sleeping but easily aroused to name being called. Pt administered Zoloft and Clonidine (new meds). Writer provided education on indication for both medications. Pt verbalized understanding and agreemet to take medications. Informed pt to notify staff with any SE. Verbalized agreement. Will continue to monitor for safety.

## 2021-09-23 NOTE — ED Notes (Signed)
Patient A&O x 4, ambulatory. Patient discharged AMA in no acute distress. Patient denied SI/HI, A/VH upon discharge. Patient verbalized understanding of all discharge instructions explained by staff, to include follow up appointments, RX's and safety plan. Pt belongings returned to patient from locker #17 intact. Patient escorted to lobby via staff for transport to destination. Safety maintained.

## 2021-09-23 NOTE — ED Notes (Signed)
Notified by registration that pt's wife is here requesting to speak to someone and see pt. Staff advised cannot release any pt information without pt consent and that no visitors are allowed. Wife stated she felt staff was lying to her and handed registration a note to give to pt. Note placed in pt's chart to be reviewed by provider today per Theda Belfast, RN/AC.

## 2021-09-23 NOTE — ED Notes (Signed)
Pt asleep in bed. Respirations even and unlabored. Monitoring for safety. 
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
## 2021-09-23 NOTE — Discharge Instructions (Addendum)
Pt is leaving AMA.   In the event of worsening symptoms, patient is instructed to call the crisis hotline, 911 and or go to the nearest ED for appropriate evaluation and treatment of symptoms. To follow-up with his/her primary care provider for your other medical issues, concerns and or health care needs.

## 2021-09-23 NOTE — ED Notes (Signed)
Pt approached nurses station, after getting off phone with his GF, and informed writer that his girlfriend/wife, Lelon Mast has his permission to receive information on his status at facility. Staff made aware, including MD. Pt returned to his room. Denies concerns or needs at present. Will continue to monitor for safety.

## 2021-09-23 NOTE — ED Notes (Addendum)
Pt noted with increased anxiety, pacing and went into bathroom when staff overheard pt to be vomiting. Writer went to bathroom and observed pt over commode vomiting yellow bile. Pt had received Vistaril prior to this incident due to pt being restless, pacing. Pt states, "I have to be honest with you. I am withdrawing from heroin. I didn't say anything on admission because I didn't want my mom to know what I was doing. We already got one brother that is addicted to heroin. I do drink ETOH but I started using heroin 6 months ago. I last used 2 days ago. Writer observed pt with tremors. Pt received Ativan and Zofran for sx mgmt. MD notified via secure chat of new findings. Pt apologetic for not being informing staff earlier. Support and encouragement provided. Will continue to monitor for safety. Pt laying on bed resting with eyes closed.

## 2021-09-25 ENCOUNTER — Other Ambulatory Visit: Payer: Self-pay

## 2021-10-02 ENCOUNTER — Other Ambulatory Visit: Payer: Self-pay

## 2021-12-24 ENCOUNTER — Other Ambulatory Visit (HOSPITAL_BASED_OUTPATIENT_CLINIC_OR_DEPARTMENT_OTHER): Payer: Self-pay

## 2022-04-07 ENCOUNTER — Emergency Department (HOSPITAL_COMMUNITY)
Admission: EM | Admit: 2022-04-07 | Discharge: 2022-04-07 | Discharge status: Against Medical Advice | Payer: BC Managed Care – PPO | Attending: Emergency Medicine | Admitting: Emergency Medicine

## 2022-04-07 DIAGNOSIS — T50904A Poisoning by unspecified drugs, medicaments and biological substances, undetermined, initial encounter: Secondary | ICD-10-CM

## 2022-04-07 DIAGNOSIS — T887XXA Unspecified adverse effect of drug or medicament, initial encounter: Secondary | ICD-10-CM | POA: Insufficient documentation

## 2022-04-07 DIAGNOSIS — T405X4A Poisoning by cocaine, undetermined, initial encounter: Secondary | ICD-10-CM | POA: Insufficient documentation

## 2022-04-07 NOTE — ED Triage Notes (Signed)
Pt refuses to stay an be seen, IV removed and eloped prior to MD evaluation

## 2022-04-07 NOTE — ED Provider Notes (Signed)
  [facility] Provider Note   CSN: [id number] Arrival date & time: 04/07/22  [id number]     History  No chief complaint on file.   Johnny Powell is a 27 y.o. male.  The history is provided by the patient and the EMS personnel.  Drug Overdose This is a new problem. The problem occurs constantly. The problem has been resolved. Nothing aggravates the symptoms. Nothing relieves the symptoms. Treatments tried: narcan. The treatment provided significant relief.  Patient reportedly is an alcohol and cocaine user.  EMS called out for ingestion.  Reported pinpoint pupils and given narcan with good response.  Patient stating he is fine      Home Medications Prior to Admission medications   Medication Sig Start Date End Date Taking? Authorizing Provider  sertraline (ZOLOFT) 50 MG tablet Take 1 tablet (50 mg total) by mouth daily. 09/24/21   Armando Reichert, MD      Allergies    Patient has no known allergies.    Review of Systems   Review of Systems  HENT:  Negative for facial swelling.   Eyes:  Negative for redness.    Physical Exam Updated Vital Signs There were no vitals taken for this visit. Physical Exam Vitals and nursing note reviewed. Exam conducted with a chaperone present.  Constitutional:      General: He is not in acute distress.    Appearance: He is well-developed. He is not diaphoretic.  HENT:     Head: Normocephalic and atraumatic.     Nose: Nose normal.  Eyes:     Conjunctiva/sclera: Conjunctivae normal.     Pupils: Pupils are equal, round, and reactive to light.  Cardiovascular:     Rate and Rhythm: Normal rate and regular rhythm.  Pulmonary:     Effort: Pulmonary effort is normal.     Breath sounds: Normal breath sounds. No wheezing or rales.  Abdominal:     General: Bowel sounds are normal.     Palpations: Abdomen is soft.     Tenderness: There is no abdominal tenderness. There is no guarding or rebound.   Musculoskeletal:        General: Normal range of motion.     Cervical back: Normal range of motion and neck supple.  Skin:    General: Skin is warm and dry.  Neurological:     Mental Status: He is alert and oriented to person, place, and time.  Psychiatric:        Mood and Affect: Mood normal.     ED Results / Procedures / Treatments   Labs (all labs ordered are listed, but only abnormal results are displayed) Labs Reviewed - No data to display  EKG None  Radiology No results found.  Procedures Procedures    Medications Ordered in ED Medications - No data to display  ED Course/ Medical Decision Making/ A&P                             Medical Decision Making   Final Clinical Impression(s) / ED Diagnoses Final diagnoses:  None   Patient eloped prior to labs or imaging  Rx / DC Orders ED Discharge Orders     None         Zuhayr Deeney, MD 04/07/22 [id number]

## 2022-07-05 ENCOUNTER — Emergency Department (HOSPITAL_COMMUNITY)
Admission: EM | Admit: 2022-07-05 | Discharge: 2022-07-05 | Discharge status: Against Medical Advice | Payer: Self-pay | Attending: Emergency Medicine | Admitting: Emergency Medicine

## 2022-07-05 ENCOUNTER — Encounter (HOSPITAL_COMMUNITY): Payer: Self-pay

## 2022-07-05 ENCOUNTER — Other Ambulatory Visit: Payer: Self-pay

## 2022-07-05 DIAGNOSIS — S01501A Unspecified open wound of lip, initial encounter: Secondary | ICD-10-CM | POA: Insufficient documentation

## 2022-07-05 DIAGNOSIS — Z5321 Procedure and treatment not carried out due to patient leaving prior to being seen by health care provider: Secondary | ICD-10-CM | POA: Insufficient documentation

## 2022-07-05 NOTE — ED Notes (Signed)
Pt said they were leaving.
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
## 2023-02-16 ENCOUNTER — Emergency Department (HOSPITAL_COMMUNITY): Payer: Self-pay

## 2023-02-16 ENCOUNTER — Encounter (HOSPITAL_COMMUNITY): Payer: Self-pay

## 2023-02-16 ENCOUNTER — Emergency Department (HOSPITAL_COMMUNITY)
Admission: EM | Admit: 2023-02-16 | Discharge: 2023-02-16 | Discharge status: Against Medical Advice | Payer: Self-pay | Attending: Emergency Medicine | Admitting: Emergency Medicine

## 2023-02-16 DIAGNOSIS — R079 Chest pain, unspecified: Secondary | ICD-10-CM | POA: Insufficient documentation

## 2023-02-16 DIAGNOSIS — Z5321 Procedure and treatment not carried out due to patient leaving prior to being seen by health care provider: Secondary | ICD-10-CM | POA: Insufficient documentation

## 2023-02-16 LAB — CBC
HCT: 41.8 % (ref 39.0–52.0)
Hemoglobin: 14.5 g/dL (ref 13.0–17.0)
MCH: 32.6 pg (ref 26.0–34.0)
MCHC: 34.7 g/dL (ref 30.0–36.0)
MCV: 93.9 fL (ref 80.0–100.0)
Platelets: 352 10*3/uL (ref 150–400)
RBC: 4.45 MIL/uL (ref 4.22–5.81)
RDW: 12.2 % (ref 11.5–15.5)
WBC: 8.8 10*3/uL (ref 4.0–10.5)
nRBC: 0 % (ref 0.0–0.2)

## 2023-02-16 LAB — BASIC METABOLIC PANEL
Anion gap: 13 (ref 5–15)
BUN: 14 mg/dL (ref 6–20)
CO2: 23 mmol/L (ref 22–32)
Calcium: 8.7 mg/dL — ABNORMAL LOW (ref 8.9–10.3)
Chloride: 105 mmol/L (ref 98–111)
Creatinine, Ser: 1.13 mg/dL (ref 0.61–1.24)
GFR, Estimated: >60 mL/min (ref >60)
Glucose, Bld: 121 mg/dL — ABNORMAL HIGH (ref 70–99)
Potassium: 3.2 mmol/L — ABNORMAL LOW (ref 3.5–5.1)
Sodium: 141 mmol/L (ref 135–145)

## 2023-02-16 LAB — TROPONIN I (HIGH SENSITIVITY): Troponin I (High Sensitivity): 8 ng/L (ref <18)

## 2023-02-16 MED ORDER — NAPROXEN 250 MG PO TABS: 500.0000 mg | ORAL_TABLET | Freq: Once | ORAL | Status: DC

## 2023-02-16 NOTE — ED Triage Notes (Signed)
[facility] going on for 3 days.

## 2023-02-16 NOTE — ED Notes (Signed)
Called pt for 2nd trop, no answer.
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
## 2023-02-16 NOTE — ED Notes (Signed)
Called pt for 2nd trop, no answer.  KM

## 2023-02-17 ENCOUNTER — Ambulatory Visit (INDEPENDENT_AMBULATORY_CARE_PROVIDER_SITE_OTHER): Payer: Self-pay

## 2023-02-17 ENCOUNTER — Other Ambulatory Visit: Payer: Self-pay

## 2023-02-17 ENCOUNTER — Encounter (HOSPITAL_COMMUNITY): Payer: Self-pay

## 2023-02-17 ENCOUNTER — Ambulatory Visit (HOSPITAL_COMMUNITY)
Admission: EM | Admit: 2023-02-17 | Discharge: 2023-02-17 | Disposition: A | Discharge status: Home / Self Care | Payer: Self-pay | Attending: Emergency Medicine | Admitting: Emergency Medicine

## 2023-02-17 DIAGNOSIS — R0781 Pleurodynia: Secondary | ICD-10-CM

## 2023-02-17 MED ORDER — NAPROXEN 500 MG PO TABS
500.0000 mg | ORAL_TABLET | Freq: Two times a day (BID) | ORAL | 0 refills | Status: AC
  Filled 2023-02-17: qty 30, 15d supply, fill #0
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
## 2023-02-17 NOTE — Discharge Instructions (Addendum)
Your chest x-ray was negative for any rib fractures. I have prescribed naproxen that you can take twice daily as needed for pain.  You can alternate this with Tylenol as needed.  If you develop severe chest pain, trouble breathing, or any signs of severe bleeding please seek medical treatment in the ER.

## 2023-02-18 ENCOUNTER — Other Ambulatory Visit: Payer: Self-pay | Admitting: Psychiatry

## 2023-02-18 ENCOUNTER — Other Ambulatory Visit: Payer: Self-pay

## 2023-03-11 ENCOUNTER — Other Ambulatory Visit: Payer: Self-pay

## 2023-03-19 ENCOUNTER — Other Ambulatory Visit: Payer: Self-pay
